# Patient Record
Sex: Male | Born: 1990 | Race: Black or African American | Hispanic: No | Marital: Single | Smoking: Never smoker
Health system: Southern US, Community
[De-identification: ages and names within clinical notes are randomized; demographics above are authoritative.]

---

## 2017-07-20 ENCOUNTER — Other Ambulatory Visit: Payer: Self-pay

## 2017-07-20 ENCOUNTER — Encounter (HOSPITAL_COMMUNITY): Payer: Self-pay | Admitting: Emergency Medicine

## 2017-07-20 ENCOUNTER — Emergency Department (HOSPITAL_COMMUNITY)
Admission: EM | Admit: 2017-07-20 | Discharge: 2017-07-20 | Disposition: A | Discharge status: Home / Self Care | Payer: Worker's Compensation | Attending: Emergency Medicine | Admitting: Emergency Medicine

## 2017-07-20 ENCOUNTER — Emergency Department (HOSPITAL_COMMUNITY): Payer: Worker's Compensation

## 2017-07-20 DIAGNOSIS — S99912A Unspecified injury of left ankle, initial encounter: Secondary | ICD-10-CM | POA: Diagnosis present

## 2017-07-20 DIAGNOSIS — X509XXA Other and unspecified overexertion or strenuous movements or postures, initial encounter: Secondary | ICD-10-CM | POA: Diagnosis not present

## 2017-07-20 DIAGNOSIS — Y999 Unspecified external cause status: Secondary | ICD-10-CM | POA: Insufficient documentation

## 2017-07-20 DIAGNOSIS — S93492A Sprain of other ligament of left ankle, initial encounter: Secondary | ICD-10-CM

## 2017-07-20 DIAGNOSIS — Y929 Unspecified place or not applicable: Secondary | ICD-10-CM | POA: Diagnosis not present

## 2017-07-20 DIAGNOSIS — Y9367 Activity, basketball: Secondary | ICD-10-CM | POA: Insufficient documentation

## 2017-07-20 NOTE — ED Notes (Signed)
Pt bk from xray

## 2017-07-20 NOTE — Discharge Instructions (Signed)
X-rays were negative. I suspect a soft tissue injury. Rest and avoid further injury until you're evaluated by orthopedist. Take ibuprofen and Tylenol for inflammation and pain. Ice. Contact Dr. Carola FrostHandy for reevaluation as soon as possible for further evaluation and management.

## 2017-07-20 NOTE — ED Provider Notes (Signed)
[facility]   CSN: [id number] Arrival date & time: 07/20/17  2219     History   Chief Complaint Chief Complaint  Patient presents with  . Ankle Pain    HPI Jaime Solis is a 26 y.o. male with no significant past medical history presents for evaluation of sudden onset intermittent left lateral ankle pain and swelling after inversion injury. 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 Pain with movement, palpation and weightbearing. No previous injury or surgery to the joint. Was initially evaluated by team flutter trainer who put him on a cam walker. No other interventions PTA. Denies numbness or tingling distally.  HPI  History reviewed. No pertinent past medical history.  There are no active problems to display for this patient.   History reviewed. No pertinent surgical history.     Home Medications    Prior to Admission medications   Not on File    Family History No family history on file.  Social History Social History   Tobacco Use  . Smoking status: Never Smoker  . Smokeless tobacco: Never Used  Substance Use Topics  . Alcohol use: Yes  . Drug use: No     Allergies   Patient has no known allergies.   Review of Systems Review of Systems  Musculoskeletal: Positive for arthralgias, gait problem and joint swelling.  All other systems reviewed and are negative.    Physical Exam Updated Vital Signs BP (!) 147/91 (BP Location: Left Arm)   Pulse 70   Temp (!) 97.4 F (36.3 C) (Oral)   Resp 16   Ht 6\' 9"  (2.057 m)   Wt 104.3 kg (230 lb)   SpO2 100%   BMI 24.65 kg/m   Physical Exam  Constitutional: He is oriented to person, place, and time. He appears well-developed and well-nourished. No distress.  NAD.  HENT:  Head: Normocephalic and atraumatic.  Right Ear: External ear normal.  Left Ear: External ear normal.  Nose: Nose normal.  Eyes:  Conjunctivae and EOM are normal. No scleral icterus.  Neck: Normal range of motion. Neck supple.  Cardiovascular: Normal rate and intact distal pulses.  Pulmonary/Chest: Effort normal.  Musculoskeletal: Normal range of motion. He exhibits edema and tenderness. He exhibits no deformity.  Focal tenderness to left lateral malleolus with edema. Positive talar tilt. Full passive range of motion of the left ankle with mild pain. No focal tenderness to Achilles, calf or heel. Negative anterior and posterior drawer.Negative syndesmosis squeeze test. Negative Thompson test.   Neurological: He is alert and oriented to person, place, and time.  Skin: Skin is warm and dry. Capillary refill takes less than 2 seconds.  Psychiatric: He has a normal mood and affect. His behavior is normal. Judgment and thought content normal.  Nursing note and vitals reviewed.    ED Treatments / Results  Labs (all labs ordered are listed, but only abnormal results are displayed) Labs Reviewed - No data to display  EKG  EKG Interpretation None       Radiology Dg Ankle Complete Left  Result Date: 07/20/2017 CLINICAL DATA:  Basketball injury, twisted foot. EXAM: LEFT ANKLE COMPLETE - 3+ VIEW COMPARISON:  None. FINDINGS: No fracture deformity nor dislocation. The ankle mortise appears congruent and the tibiofibular syndesmosis intact. No destructive bony lesions. Lateral ankle soft tissue swelling without subcutaneous gas or radiopaque foreign bodies. IMPRESSION: Soft tissue swelling.  No fracture deformity  or dislocation. Electronically Signed   By: Awilda Metroourtnay  Bloomer M.D.   On: 07/20/2017 23:08    Procedures Procedures (including critical care time)  Medications Ordered in ED Medications - No data to display   Initial Impression / Assessment and Plan / ED Course  I have reviewed the triage vital signs and the nursing notes.  Pertinent labs & imaging results that were available during my care of the patient were  reviewed by me and considered in my medical decision making (see chart for details).    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 X-ray negative. He has full passive range of motion of the joint with mild pain. Extremity is neurovascularly intact. We'll discharged with high-dose NSAIDs and follow up with orthopedist for reevaluation. Pt and team athletic trainer at bedside agreeable with ED tx and plan.   Final Clinical Impressions(s) / ED Diagnoses   Final diagnoses:  Sprain of other ligament of left ankle, initial encounter    ED Discharge Orders    None       Jerrell MylarGibbons, Enmanuel Zufall J, PA-C 07/20/17 2334    Vanetta MuldersZackowski, Scott, MD 07/21/17 [phone]

## 2017-07-20 NOTE — ED Triage Notes (Signed)
Pt c/o left ankle pain and swelling after falling while playing basketball. CAM walker applied PTA, sent by team MD for xray.

## 2017-07-20 NOTE — ED Notes (Signed)
Pt discharged from ED; instructions provided; Pt encouraged to return to ED if symptoms worsen and to f/u with PCP; Pt verbalized understanding of all instructions 

## 2017-07-20 NOTE — ED Notes (Signed)
Patient transported to X-ray 

## 2019-02-27 IMAGING — DX DG ANKLE COMPLETE 3+V*L*
3 series · 3 of 3 positions shown · non-contrast
Comparison: None.

CLINICAL DATA: Basketball injury, twisted foot.

EXAM:
LEFT ANKLE COMPLETE - 3+ VIEW

[ankle ap]
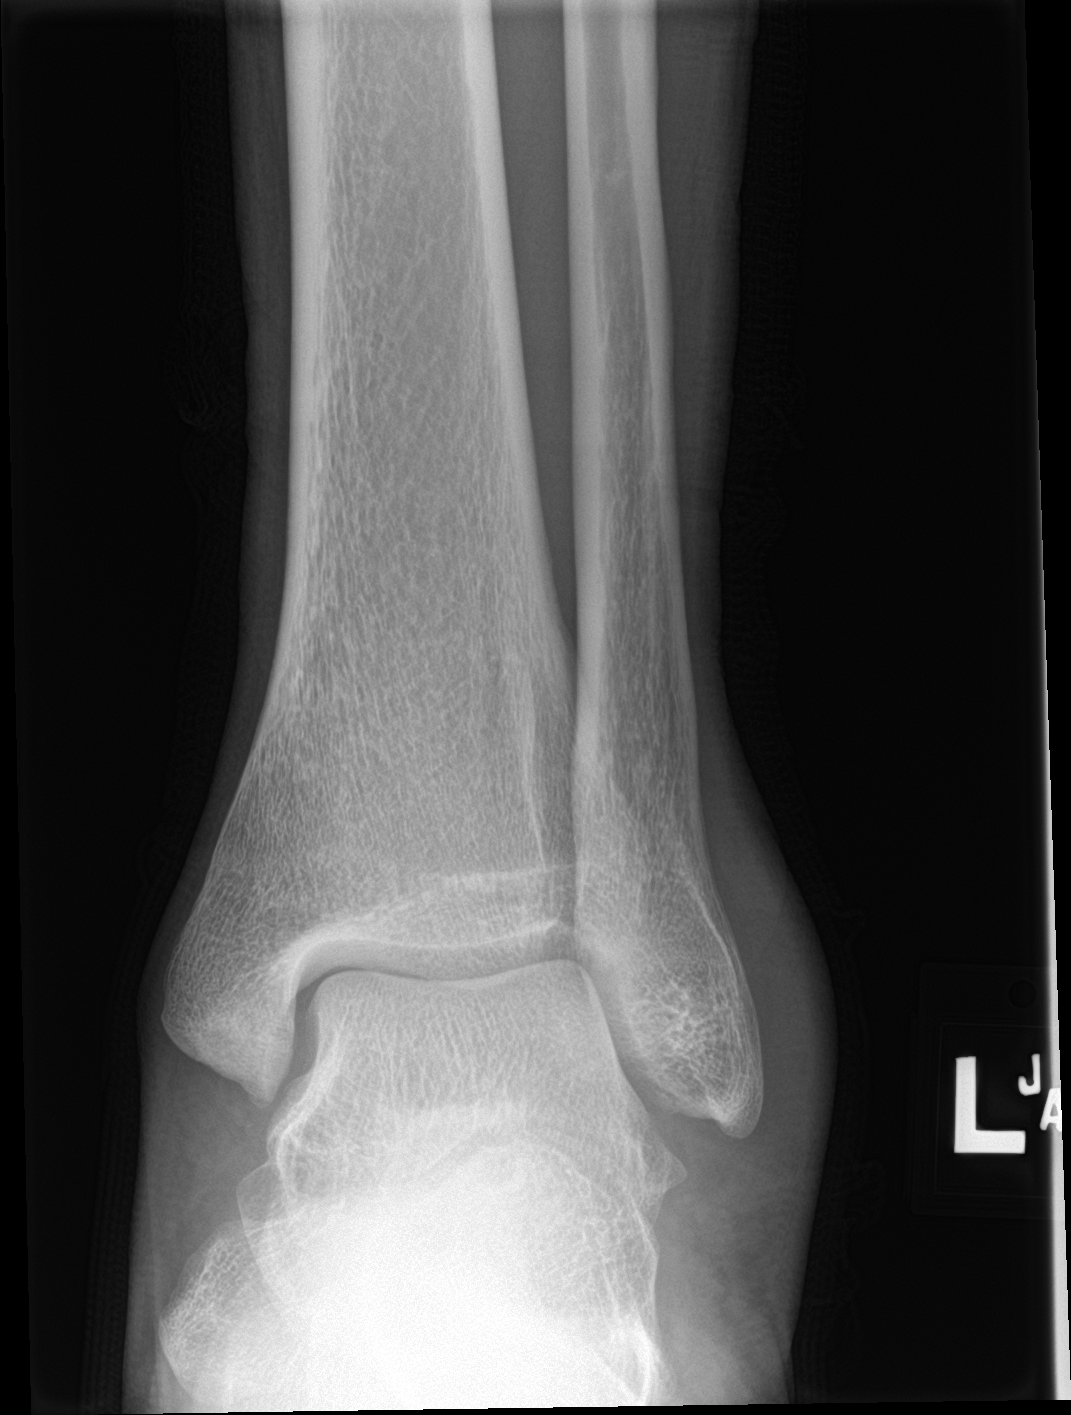

[ankle obl]
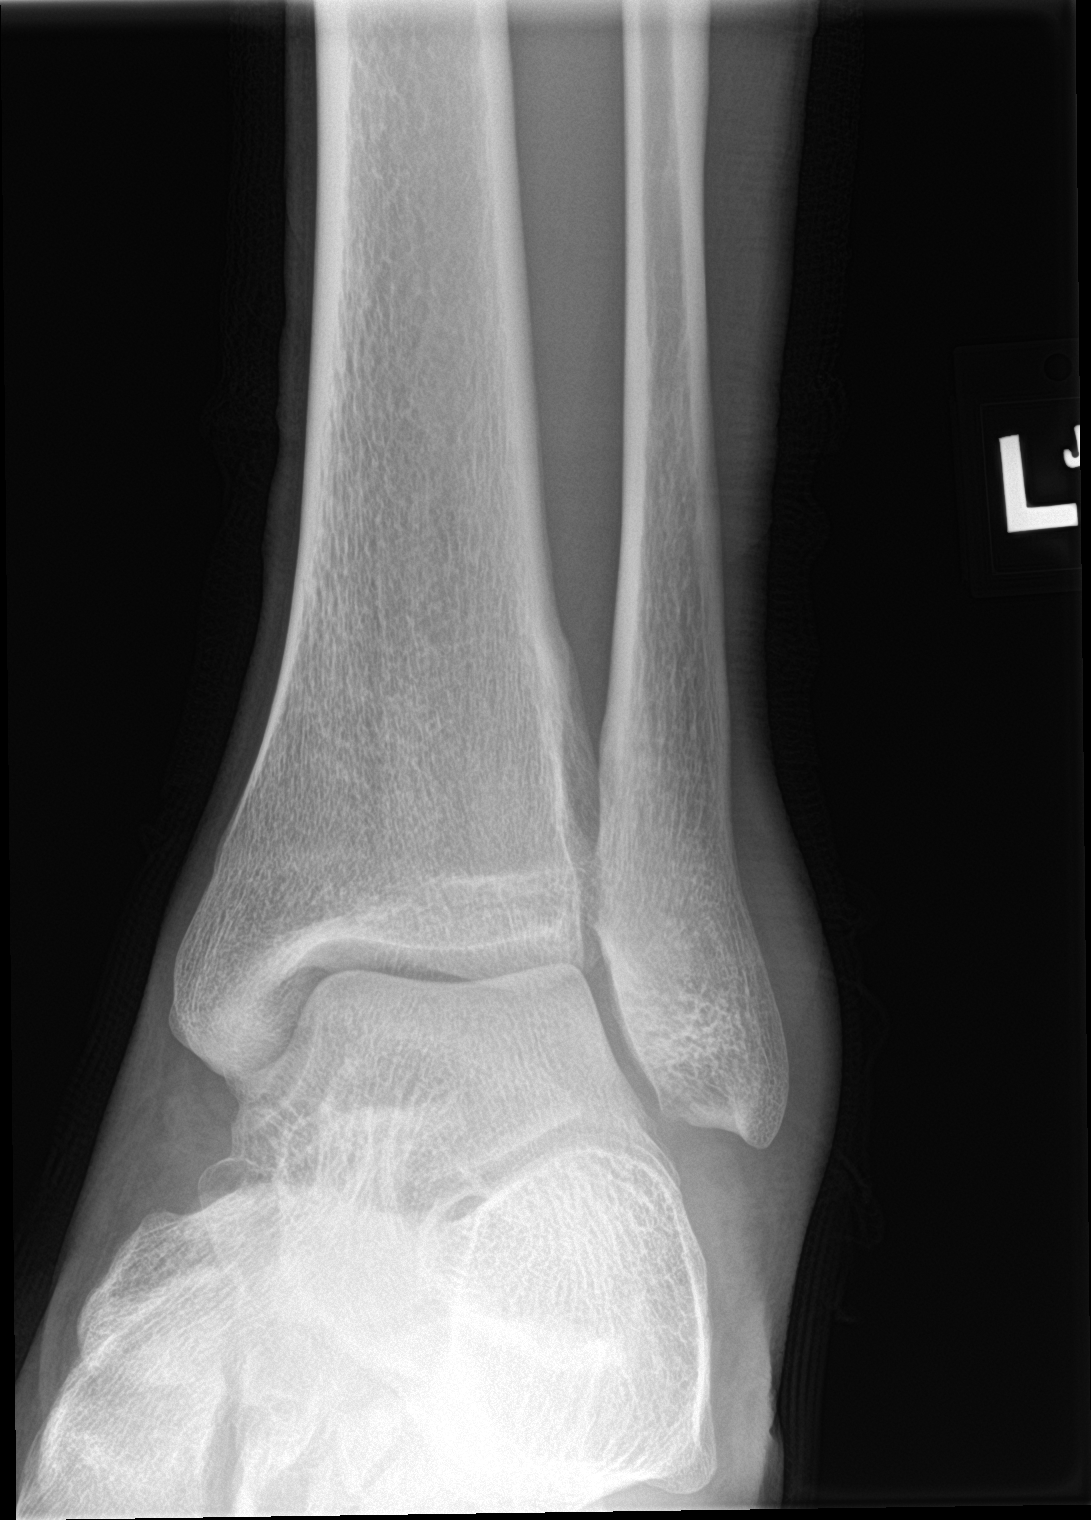

[ankle lat]
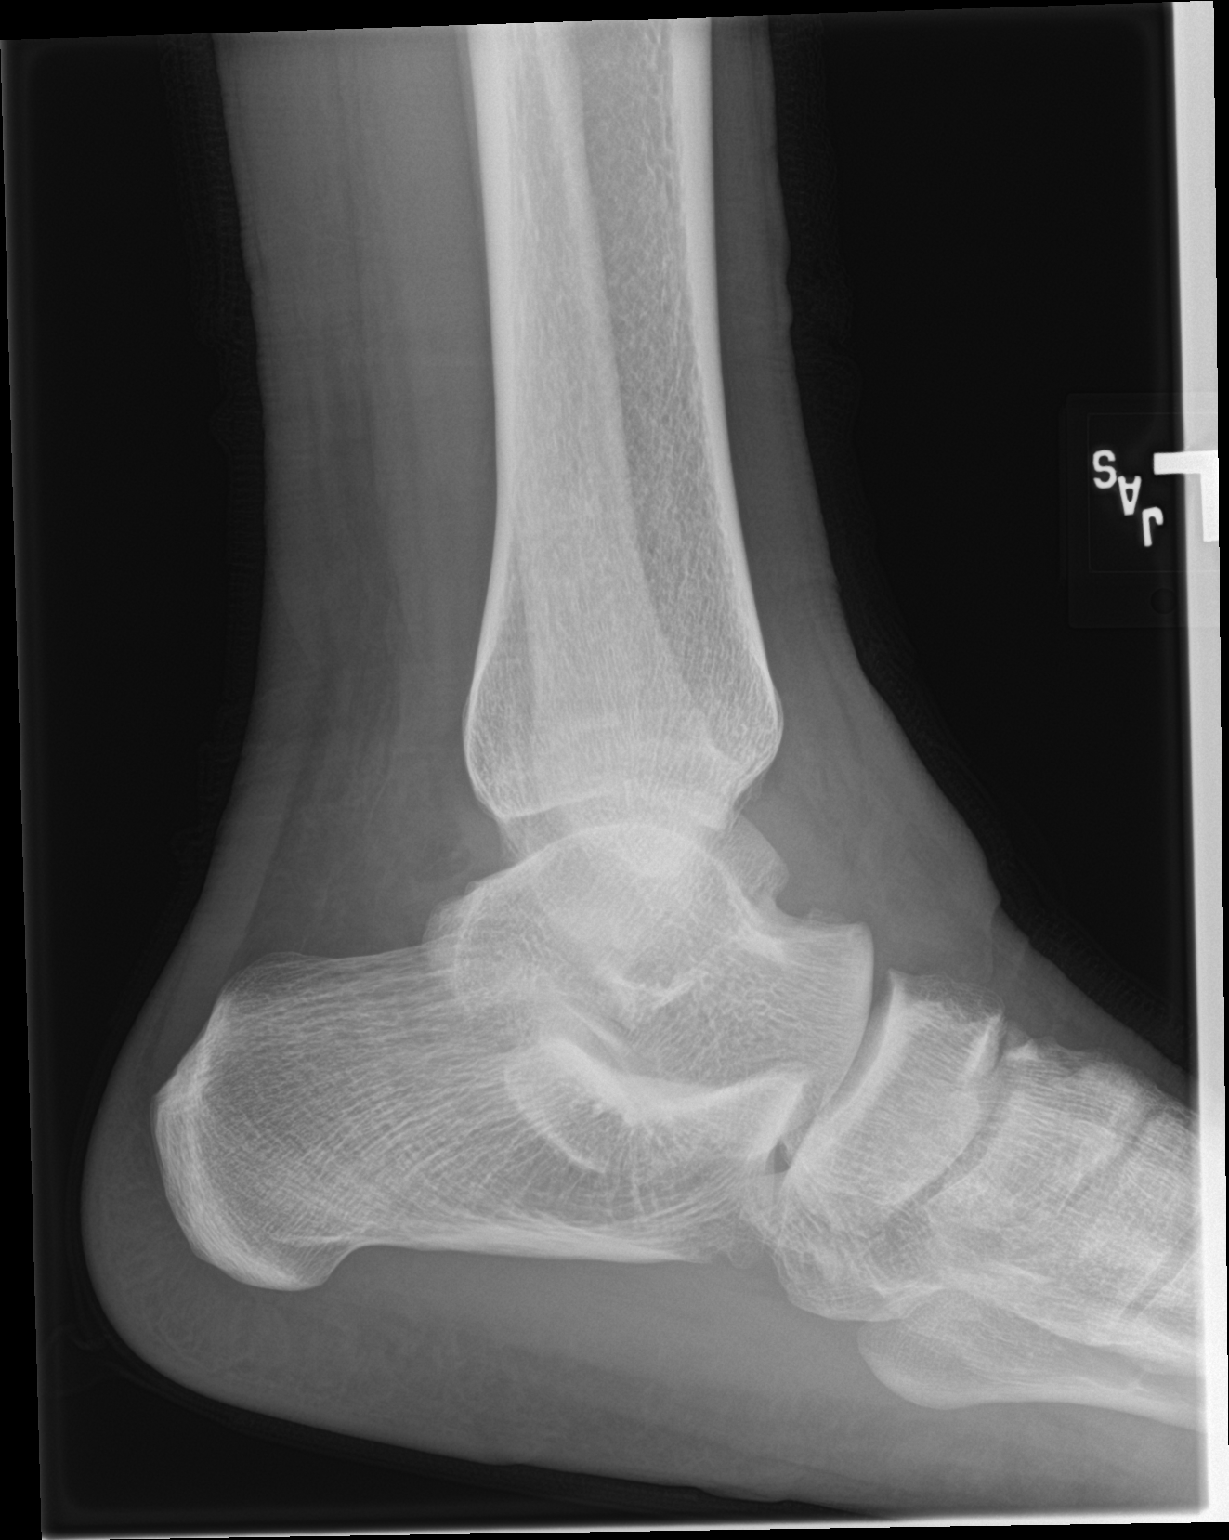

[3 of 3 positions shown; findings below may reference images not displayed]

FINDINGS: No fracture deformity nor dislocation. The ankle mortise appears
congruent and the tibiofibular syndesmosis intact. No destructive
bony lesions. Lateral ankle soft tissue swelling without
subcutaneous gas or radiopaque foreign bodies.
IMPRESSION: Soft tissue swelling.  No fracture deformity or dislocation.
